# Patient Record
Sex: Male | Born: 1985 | Race: Black or African American | Hispanic: No | Marital: Single
Health system: Southern US, Community
[De-identification: ages and names within clinical notes are randomized; demographics above are authoritative.]

## PROBLEM LIST (undated history)

## (undated) ENCOUNTER — Emergency Department: Admission: EM | Payer: Medicaid Other | Source: Home / Self Care

---
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
## 2005-12-29 ENCOUNTER — Emergency Department: Payer: Self-pay | Admitting: Emergency Medicine

## 2006-12-08 ENCOUNTER — Emergency Department: Payer: Self-pay | Admitting: Unknown Physician Specialty

## 2008-02-17 ENCOUNTER — Inpatient Hospital Stay: Payer: Self-pay | Admitting: Orthopedic Surgery

## 2008-04-15 ENCOUNTER — Ambulatory Visit: Payer: Self-pay | Admitting: Orthopedic Surgery
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
## 2009-12-05 IMAGING — CR RIGHT HIP - 1 VIEW
1 series · 1 of 1 positions shown · non-contrast
Comparison: none

REASON FOR EXAM: fractured right hip
COMMENTS:

[view not recorded]
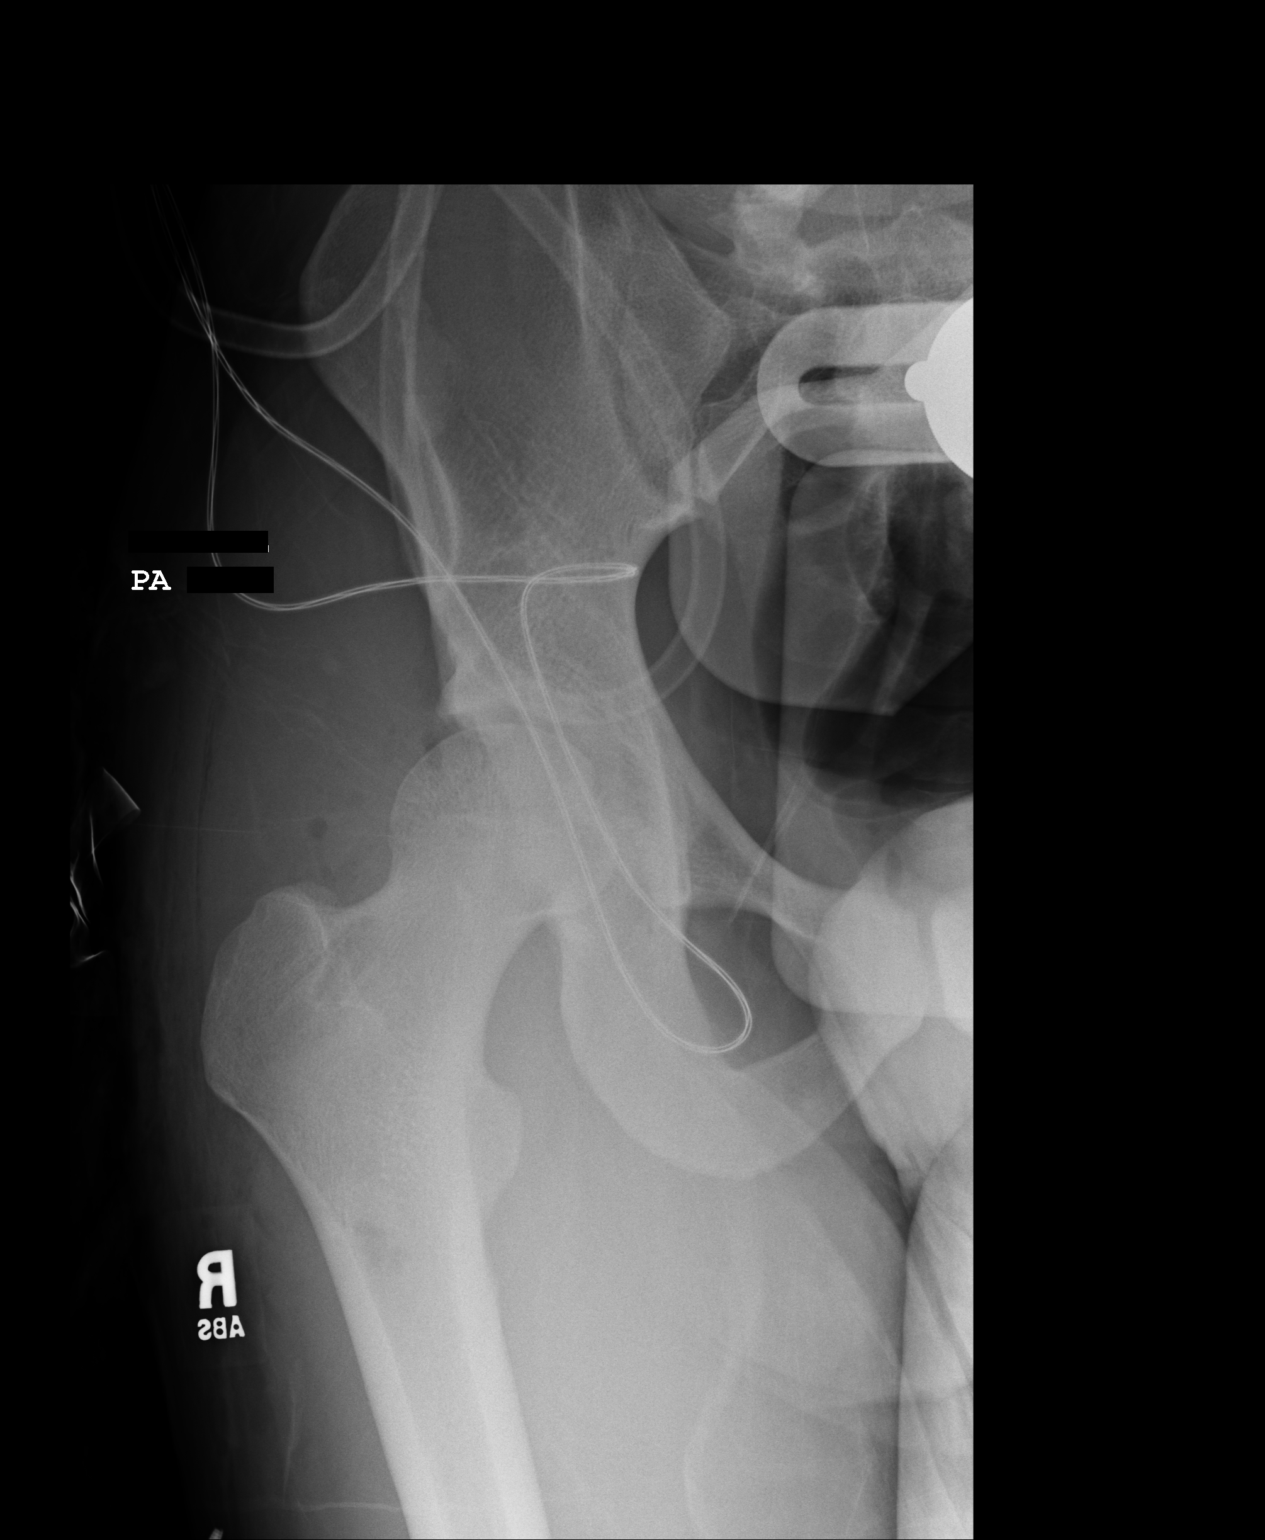

[1 of 1 positions shown; findings below may reference images not displayed]

PROCEDURE:     DXR - DXR HIP RIGHT ONE VIEW  - February 18, 2008  [DATE]

RESULT:     An AP view of the hip is compared to the prior exam of earlier
this date. The previously noted bone density projected along the superior
aspect of the RIGHT hip joint is no longer seen and has apparently been
surgically removed.
IMPRESSION: 1.     The previously noted bone fragment noted along the superior portion
of the RIGHT hip joint space is no longer seen.

## 2010-03-10 ENCOUNTER — Emergency Department: Payer: Self-pay | Admitting: Internal Medicine

## 2021-09-07 ENCOUNTER — Emergency Department: Payer: Medicaid Other

## 2021-09-07 ENCOUNTER — Emergency Department
Admission: EM | Admit: 2021-09-07 | Discharge: 2021-09-07 | Discharge status: Against Medical Advice | Payer: Medicaid Other | Attending: Emergency Medicine | Admitting: Emergency Medicine

## 2021-09-07 ENCOUNTER — Encounter: Payer: Self-pay | Admitting: Emergency Medicine

## 2021-09-07 DIAGNOSIS — M545 Low back pain, unspecified: Secondary | ICD-10-CM | POA: Diagnosis not present

## 2021-09-07 DIAGNOSIS — Z5321 Procedure and treatment not carried out due to patient leaving prior to being seen by health care provider: Secondary | ICD-10-CM | POA: Insufficient documentation

## 2021-09-07 LAB — URINALYSIS, ROUTINE W REFLEX MICROSCOPIC
Bacteria, UA: NONE SEEN
Bilirubin Urine: NEGATIVE
Glucose, UA: NEGATIVE mg/dL
Ketones, ur: 20 mg/dL — AB
Leukocytes,Ua: NEGATIVE
Nitrite: NEGATIVE
Protein, ur: NEGATIVE mg/dL
Specific Gravity, Urine: 1.005 (ref 1.005–1.030)
pH: 6 (ref 5.0–8.0)

## 2021-09-07 LAB — COMPREHENSIVE METABOLIC PANEL
ALT: 22 U/L (ref 0–44)
AST: 27 U/L (ref 15–41)
Albumin: 4 g/dL (ref 3.5–5.0)
Alkaline Phosphatase: 36 U/L — ABNORMAL LOW (ref 38–126)
Anion gap: 10 (ref 5–15)
BUN: 6 mg/dL (ref 6–20)
CO2: 28 mmol/L (ref 22–32)
Calcium: 8.7 mg/dL — ABNORMAL LOW (ref 8.9–10.3)
Chloride: 92 mmol/L — ABNORMAL LOW (ref 98–111)
Creatinine, Ser: 1.17 mg/dL (ref 0.61–1.24)
GFR, Estimated: >60 mL/min (ref >60)
Glucose, Bld: 126 mg/dL — ABNORMAL HIGH (ref 70–99)
Potassium: 3.1 mmol/L — ABNORMAL LOW (ref 3.5–5.1)
Sodium: 130 mmol/L — ABNORMAL LOW (ref 135–145)
Total Bilirubin: 1.4 mg/dL — ABNORMAL HIGH (ref 0.3–1.2)
Total Protein: 7.9 g/dL (ref 6.5–8.1)

## 2021-09-07 LAB — CBC
HCT: 42.1 % (ref 39.0–52.0)
Hemoglobin: 15.1 g/dL (ref 13.0–17.0)
MCH: 29.4 pg (ref 26.0–34.0)
MCHC: 35.9 g/dL (ref 30.0–36.0)
MCV: 82.1 fL (ref 80.0–100.0)
Platelets: 160 10*3/uL (ref 150–400)
RBC: 5.13 MIL/uL (ref 4.22–5.81)
RDW: 13.4 % (ref 11.5–15.5)
WBC: 12.8 10*3/uL — ABNORMAL HIGH (ref 4.0–10.5)
nRBC: 0 % (ref 0.0–0.2)

## 2021-09-07 LAB — LACTIC ACID, PLASMA: Lactic Acid, Venous: 0.9 mmol/L (ref 0.5–1.9)

## 2021-09-07 MED ORDER — ACETAMINOPHEN 500 MG PO TABS
1000.0000 mg | ORAL_TABLET | Freq: Once | ORAL | Status: AC
  Administered 2021-09-07: 1000 mg via ORAL
  Filled 2021-09-07: qty 2

## 2021-09-07 NOTE — ED Triage Notes (Addendum)
Pt presents via POV with complaints of bilateral lower back pain that started yesterday afternoon. He states he has been urinating like normal - no loss of bowel control or injury.  He notes using OTC pain meds and a warm compress that hasn't  helped.  Denies CP, SOB,  N/V.

## 2021-09-08 ENCOUNTER — Encounter: Payer: Self-pay | Admitting: Emergency Medicine

## 2021-09-08 ENCOUNTER — Emergency Department
Admission: EM | Admit: 2021-09-08 | Discharge: 2021-09-08 | Disposition: A | Discharge status: Home / Self Care | Payer: Medicaid Other | Attending: Emergency Medicine | Admitting: Emergency Medicine

## 2021-09-08 ENCOUNTER — Other Ambulatory Visit: Payer: Self-pay

## 2021-09-08 DIAGNOSIS — Z20822 Contact with and (suspected) exposure to covid-19: Secondary | ICD-10-CM | POA: Insufficient documentation

## 2021-09-08 DIAGNOSIS — M545 Low back pain, unspecified: Secondary | ICD-10-CM | POA: Diagnosis not present

## 2021-09-08 DIAGNOSIS — D72829 Elevated white blood cell count, unspecified: Secondary | ICD-10-CM | POA: Diagnosis not present

## 2021-09-08 DIAGNOSIS — J181 Lobar pneumonia, unspecified organism: Secondary | ICD-10-CM | POA: Diagnosis not present

## 2021-09-08 DIAGNOSIS — J189 Pneumonia, unspecified organism: Secondary | ICD-10-CM

## 2021-09-08 DIAGNOSIS — R748 Abnormal levels of other serum enzymes: Secondary | ICD-10-CM | POA: Insufficient documentation

## 2021-09-08 DIAGNOSIS — E871 Hypo-osmolality and hyponatremia: Secondary | ICD-10-CM | POA: Insufficient documentation

## 2021-09-08 DIAGNOSIS — E860 Dehydration: Secondary | ICD-10-CM | POA: Diagnosis not present

## 2021-09-08 DIAGNOSIS — E876 Hypokalemia: Secondary | ICD-10-CM | POA: Insufficient documentation

## 2021-09-08 DIAGNOSIS — R509 Fever, unspecified: Secondary | ICD-10-CM | POA: Diagnosis present

## 2021-09-08 LAB — BASIC METABOLIC PANEL
Anion gap: 11 (ref 5–15)
BUN: 9 mg/dL (ref 6–20)
CO2: 29 mmol/L (ref 22–32)
Calcium: 8.5 mg/dL — ABNORMAL LOW (ref 8.9–10.3)
Chloride: 90 mmol/L — ABNORMAL LOW (ref 98–111)
Creatinine, Ser: 1.19 mg/dL (ref 0.61–1.24)
GFR, Estimated: >60 mL/min (ref >60)
Glucose, Bld: 127 mg/dL — ABNORMAL HIGH (ref 70–99)
Potassium: 3 mmol/L — ABNORMAL LOW (ref 3.5–5.1)
Sodium: 130 mmol/L — ABNORMAL LOW (ref 135–145)

## 2021-09-08 LAB — CBC WITH DIFFERENTIAL/PLATELET
Abs Immature Granulocytes: 0.09 10*3/uL — ABNORMAL HIGH (ref 0.00–0.07)
Basophils Absolute: 0.1 10*3/uL (ref 0.0–0.1)
Basophils Relative: 0 %
Eosinophils Absolute: 0.2 10*3/uL (ref 0.0–0.5)
Eosinophils Relative: 1 %
HCT: 43.6 % (ref 39.0–52.0)
Hemoglobin: 15.6 g/dL (ref 13.0–17.0)
Immature Granulocytes: 1 %
Lymphocytes Relative: 7 %
Lymphs Abs: 0.9 10*3/uL (ref 0.7–4.0)
MCH: 29.4 pg (ref 26.0–34.0)
MCHC: 35.8 g/dL (ref 30.0–36.0)
MCV: 82.3 fL (ref 80.0–100.0)
Monocytes Absolute: 1.2 10*3/uL — ABNORMAL HIGH (ref 0.1–1.0)
Monocytes Relative: 9 %
Neutro Abs: 11.4 10*3/uL — ABNORMAL HIGH (ref 1.7–7.7)
Neutrophils Relative %: 82 %
Platelets: 168 10*3/uL (ref 150–400)
RBC: 5.3 MIL/uL (ref 4.22–5.81)
RDW: 13.4 % (ref 11.5–15.5)
Smear Review: NORMAL
WBC: 13.9 10*3/uL — ABNORMAL HIGH (ref 4.0–10.5)
nRBC: 0 % (ref 0.0–0.2)

## 2021-09-08 LAB — URINALYSIS, ROUTINE W REFLEX MICROSCOPIC
Bilirubin Urine: NEGATIVE
Glucose, UA: NEGATIVE mg/dL
Ketones, ur: 20 mg/dL — AB
Leukocytes,Ua: NEGATIVE
Nitrite: NEGATIVE
Protein, ur: 30 mg/dL — AB
Specific Gravity, Urine: 1.009 (ref 1.005–1.030)
Squamous Epithelial / HPF: NONE SEEN (ref 0–5)
pH: 6 (ref 5.0–8.0)

## 2021-09-08 LAB — RESP PANEL BY RT-PCR (FLU A&B, COVID) ARPGX2
Influenza A by PCR: NEGATIVE
Influenza B by PCR: NEGATIVE
SARS Coronavirus 2 by RT PCR: NEGATIVE

## 2021-09-08 LAB — CK: Total CK: 403 U/L — ABNORMAL HIGH (ref 49–397)

## 2021-09-08 MED ORDER — ACETAMINOPHEN 500 MG PO TABS
ORAL_TABLET | ORAL | Status: AC
  Filled 2021-09-08: qty 2

## 2021-09-08 MED ORDER — SODIUM CHLORIDE 0.9 % IV BOLUS
1000.0000 mL | Freq: Once | INTRAVENOUS | Status: AC
  Administered 2021-09-08: 1000 mL via INTRAVENOUS

## 2021-09-08 MED ORDER — AZITHROMYCIN 250 MG PO TABS: ORAL_TABLET | ORAL | 0 refills | Status: AC

## 2021-09-08 MED ORDER — IBUPROFEN 600 MG PO TABS: 600.0000 mg | ORAL_TABLET | Freq: Four times a day (QID) | ORAL | 0 refills | Status: AC | PRN

## 2021-09-08 MED ORDER — SODIUM CHLORIDE 0.9 % IV SOLN
500.0000 mg | Freq: Once | INTRAVENOUS | Status: AC
  Administered 2021-09-08: 500 mg via INTRAVENOUS
  Filled 2021-09-08: qty 5

## 2021-09-08 MED ORDER — ACETAMINOPHEN 500 MG PO TABS
1000.0000 mg | ORAL_TABLET | Freq: Once | ORAL | Status: AC
  Administered 2021-09-08: 1000 mg via ORAL

## 2021-09-08 MED ORDER — KETOROLAC TROMETHAMINE 15 MG/ML IJ SOLN
15.0000 mg | Freq: Once | INTRAMUSCULAR | Status: AC
  Administered 2021-09-08: 15 mg via INTRAVENOUS
  Filled 2021-09-08: qty 1

## 2021-09-08 NOTE — ED Notes (Signed)
Pt in bed, mom at bedside, pt states that he is ready to go home.

## 2021-09-08 NOTE — Discharge Instructions (Addendum)
Take the antibiotic as prescribed and finish the full course.  You may take the ibuprofen as needed for pain.  Return to the ER for new, worsening, or persistent severe pain, weakness or numbness in the legs, difficulty walking, severe headache or neck stiffness, persistent fever, weakness, difficulty breathing, vomiting, or any other new or worsening symptoms that concern you.

## 2021-09-08 NOTE — ED Notes (Signed)
Pt in bed, sig other at bedside, pt reports decreased pain, states that he doesn't need anything for pain at this time.

## 2021-09-08 NOTE — ED Triage Notes (Signed)
Patient ambulatory to triage with steady gait, without difficulty or distress noted; pt here last night but left prior to being seen due to wait time; pt returns for further evaluation of his lower back pain

## 2021-09-08 NOTE — ED Provider Notes (Signed)
[facility] Provider Note    Event Date/Time   First MD Initiated Contact with Patient 09/08/21 0802     (approximate)   History   Back Pain   HPI  Micheal Molina is a 36 y.o. male with no active medical problems who presents with low back pain over the last few days, gradual onset, on both sides of the lower back, nonradiating, and associated today with a fever.  The patient denies headache, neck pain or stiffness, vomiting or diarrhea, cough or shortness of breath, chest pain, abdominal pain, or any other body aches or muscle pain.  He reports that he feels dehydrated and has not been urinating very much but denies any dysuria or frequency.  He has no sick contacts or other recent illness.  He uses marijuana but denies any history of IV drug use.    Physical Exam   Triage Vital Signs: ED Triage Vitals  Enc Vitals Group     BP 09/08/21 0621 125/81     Pulse Rate 09/08/21 0621 (!) 101     Resp 09/08/21 0621 20     Temp 09/08/21 0621 (!) 102.3 F (39.1 C)     Temp Source 09/08/21 0621 Oral     SpO2 09/08/21 0621 96 %     Weight --      Height --      Head Circumference --      Peak Flow --      Pain Score 09/08/21 0626 6     Pain Loc --      Pain Edu? --      Excl. in GC? --     Most recent vital signs: Vitals:   09/08/21 1123 09/08/21 1254  BP: (!) 147/80 124/70  Pulse: 85 93  Resp: 17 18  Temp:  (!) 100.6 F (38.1 C)  SpO2: 96% 98%     General: Alert and oriented, well-appearing. CV:  Good peripheral perfusion.  Resp:  Normal effort.  Lungs CTA B. Abd:  No distention.  Other:  No midline spinal tenderness.  No lower back muscle tenderness, swelling, erythema, rash, or cutaneous findings.  5/5 motor strength and intact sensation to bilateral lower extremities proximal and distal.  Neck supple, full range of motion.  No meningeal signs.   ED Results / Procedures / Treatments   Labs (all labs ordered are listed, but only abnormal  results are displayed) Labs Reviewed  BASIC METABOLIC PANEL - Abnormal; Notable for the following components:      Result Value   Sodium 130 (*)    Potassium 3.0 (*)    Chloride 90 (*)    Glucose, Bld 127 (*)    Calcium 8.5 (*)    All other components within normal limits  CBC WITH DIFFERENTIAL/PLATELET - Abnormal; Notable for the following components:   WBC 13.9 (*)    Neutro Abs 11.4 (*)    Monocytes Absolute 1.2 (*)    Abs Immature Granulocytes 0.09 (*)    All other components within normal limits  URINALYSIS, ROUTINE W REFLEX MICROSCOPIC - Abnormal; Notable for the following components:   Color, Urine YELLOW (*)    APPearance CLEAR (*)    Hgb urine dipstick MODERATE (*)    Ketones, ur 20 (*)    Protein, ur 30 (*)    Bacteria, UA RARE (*)    All other components within normal limits  CK - Abnormal; Notable for the following components:   Total  CK 403 (*)    All other components within normal limits  RESP PANEL BY RT-PCR (FLU A&B, COVID) [id number]     EKG     RADIOLOGY    PROCEDURES:  Critical Care performed: No  Procedures   MEDICATIONS ORDERED IN ED: Medications  acetaminophen (TYLENOL) 500 MG tablet (has no administration in time range)  acetaminophen (TYLENOL) tablet 1,000 mg (1,000 mg Oral Given 09/08/21 0624)  sodium chloride 0.9 % bolus 1,000 mL (0 mLs Intravenous Stopped 09/08/21 1032)  ketorolac (TORADOL) 15 MG/ML injection 15 mg (15 mg Intravenous Given 09/08/21 0841)  azithromycin (ZITHROMAX) 500 mg in sodium chloride 0.9 % 250 mL IVPB (0 mg Intravenous Stopped 09/08/21 1258)  ketorolac (TORADOL) 15 MG/ML injection 15 mg (15 mg Intravenous Given 09/08/21 1155)     IMPRESSION / MDM / ASSESSMENT AND PLAN / ED COURSE  I reviewed the triage vital signs and the nursing notes.  36 year old male with no active medical problems presents with low back pain over the last several days as well as a fever.  He denies other symptoms.  I reviewed the past medical  records; the patient has no prior visits here, however he did come to the ED last night, had labs and imaging but left prior to being seen.  I independently reviewed and interpreted the imaging from yesterday.  Both CT abdomen/pelvis and chest x-ray show a right lower lobe pneumonia.  The CT showed no other acute findings.    On exam the patient is well-appearing.  He is febrile with otherwise normal vital signs.  The physical exam is otherwise unremarkable.  There are no meningeal signs or any tenderness to the lower back.  Differential diagnosis includes, but is not limited to, pneumonia seen on the imaging yesterday, UTI/pyelonephritis, viral syndrome with myalgias, rhabdomyolysis.  There is no evidence of meningitis.  The patient has no risk factors or clinical evidence for epidural abscess or other spinal etiology of the symptoms.  Given that the patient has no cough, shortness of breath, or chest pain, I will obtain labs and a repeat urinalysis today to verify that there is no urinary source or other concerning findings.  Patient's presentation is most consistent with acute presentation with potential threat to life or bodily function.  ----------------------------------------- 12:49 PM on 09/08/2021 -----------------------------------------  Lab work-up today is reassuring.  Urinalysis shows no evidence of infection.  WBC count is slightly elevated.  Respiratory panel is negative.  BMP shows mild hyponatremia and hypokalemia consistent with dehydration, the CK is minimally elevated.  The patient has been given fluids to treat this.  On reassessment, he still has some low back pain but it is improved.  The fever is improved.  His other vital signs are normal.  At this time, there is no indication for additional imaging.  I will treat for community-acquired pneumonia seen on imaging yesterday.  There is no evidence of sepsis or systemic infection.  Consider whether the patient may benefit from  admission, however given the fact that he is overall well-appearing, has stable vital signs, is tolerating p.o., and does not require any oxygen, he is appropriate for outpatient treatment.  I ordered a dose of IV azithromycin here in the ED and will provide a prescription for p.o. azithromycin for home.  I counseled the patient on the results of the work-up and the plan of care.  I gave him thorough return precautions and he expresses understanding.   FINAL CLINICAL IMPRESSION(S) / ED DIAGNOSES  Final diagnoses:  Community acquired pneumonia of right lower lobe of lung     Rx / DC Orders   ED Discharge Orders          Ordered    azithromycin (ZITHROMAX Z-PAK) 250 MG tablet        09/08/21 1248    ibuprofen (ADVIL) 600 MG tablet  Every 6 hours PRN        09/08/21 1248             Note:  This document was prepared using Dragon voice recognition software and may include unintentional dictation errors.    Dionne Bucy, MD 09/08/21 [phone]

## 2021-09-12 LAB — CULTURE, BLOOD (ROUTINE X 2)
Culture: NO GROWTH
Special Requests: ADEQUATE
